# Patient Record
Sex: Female | Born: 1995 | Race: Black or African American | Hispanic: No | Marital: Single | Smoking: Never smoker
Health system: Southern US, Community
[De-identification: ages and names within clinical notes are randomized; demographics above are authoritative.]

## PROBLEM LIST (undated history)

## (undated) DIAGNOSIS — T7840XA Allergy, unspecified, initial encounter: Secondary | ICD-10-CM

## (undated) HISTORY — DX: Allergy, unspecified, initial encounter: T78.40XA

---
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
## 2014-12-21 ENCOUNTER — Encounter (HOSPITAL_COMMUNITY): Payer: Self-pay | Admitting: Emergency Medicine

## 2014-12-21 ENCOUNTER — Emergency Department (HOSPITAL_COMMUNITY)
Admission: EM | Admit: 2014-12-21 | Discharge: 2014-12-22 | Disposition: A | Discharge status: Home / Self Care | Payer: BC Managed Care – PPO | Attending: Emergency Medicine | Admitting: Emergency Medicine

## 2014-12-21 DIAGNOSIS — Z3202 Encounter for pregnancy test, result negative: Secondary | ICD-10-CM | POA: Insufficient documentation

## 2014-12-21 DIAGNOSIS — R52 Pain, unspecified: Secondary | ICD-10-CM

## 2014-12-21 DIAGNOSIS — R109 Unspecified abdominal pain: Secondary | ICD-10-CM

## 2014-12-21 DIAGNOSIS — M549 Dorsalgia, unspecified: Secondary | ICD-10-CM | POA: Insufficient documentation

## 2014-12-21 DIAGNOSIS — N200 Calculus of kidney: Secondary | ICD-10-CM | POA: Diagnosis not present

## 2014-12-21 NOTE — ED Notes (Signed)
Patient with flank and lower back pain.  Patient states that it started this morning on the right and has moved to the left.  Patient does have some nausea and vomiting with the pain.  Patient states she does have some urinary hesitency and retention.  She states that she has to go but only a small amount comes out.

## 2014-12-22 ENCOUNTER — Emergency Department (HOSPITAL_COMMUNITY): Payer: BC Managed Care – PPO

## 2014-12-22 LAB — URINE MICROSCOPIC-ADD ON

## 2014-12-22 LAB — URINALYSIS, ROUTINE W REFLEX MICROSCOPIC
BILIRUBIN URINE: NEGATIVE
GLUCOSE, UA: NEGATIVE mg/dL
Ketones, ur: 15 mg/dL — AB
LEUKOCYTES UA: NEGATIVE
Nitrite: NEGATIVE
PH: 5.5 (ref 5.0–8.0)
Protein, ur: NEGATIVE mg/dL
Specific Gravity, Urine: 1.036 — ABNORMAL HIGH (ref 1.005–1.030)
UROBILINOGEN UA: 0.2 mg/dL (ref 0.0–1.0)

## 2014-12-22 LAB — POC URINE PREG, ED: PREG TEST UR: NEGATIVE

## 2014-12-22 MED ORDER — ONDANSETRON 4 MG PO TBDP: 4.0000 mg | ORAL_TABLET | Freq: Three times a day (TID) | ORAL | Status: DC | PRN

## 2014-12-22 MED ORDER — TAMSULOSIN HCL 0.4 MG PO CAPS: 0.4000 mg | ORAL_CAPSULE | Freq: Every day | ORAL | Status: DC

## 2014-12-22 MED ORDER — OXYCODONE-ACETAMINOPHEN 5-325 MG PO TABS: 1.0000 | ORAL_TABLET | Freq: Four times a day (QID) | ORAL | Status: DC | PRN

## 2014-12-22 MED ORDER — KETOROLAC TROMETHAMINE 30 MG/ML IJ SOLN
30.0000 mg | Freq: Once | INTRAMUSCULAR | Status: AC
  Administered 2014-12-22: 30 mg via INTRAVENOUS
  Filled 2014-12-22: qty 1

## 2014-12-22 MED ORDER — ONDANSETRON 4 MG PO TBDP
4.0000 mg | ORAL_TABLET | Freq: Once | ORAL | Status: AC
  Administered 2014-12-22: 4 mg via ORAL
  Filled 2014-12-22: qty 1

## 2014-12-22 MED ORDER — TAMSULOSIN HCL 0.4 MG PO CAPS
0.4000 mg | ORAL_CAPSULE | Freq: Once | ORAL | Status: AC
  Administered 2014-12-22: 0.4 mg via ORAL
  Filled 2014-12-22: qty 1

## 2014-12-22 MED ORDER — HYDROMORPHONE HCL 1 MG/ML IJ SOLN
2.0000 mg | Freq: Once | INTRAMUSCULAR | Status: AC
  Administered 2014-12-22: 2 mg via INTRAMUSCULAR
  Filled 2014-12-22: qty 2

## 2014-12-22 NOTE — Discharge Instructions (Signed)
You have a 3 mm stone this should pass  You have been given prescriptions for pain and nausea control as well a Flomax that helps dilate the ureter to help the stone pass

## 2014-12-22 NOTE — ED Provider Notes (Signed)
Reviewed the Ct Scan Rx for Percocet, Flomax and Zofran given with urology FU to return if can not manage pain at home   Arman Filter, NP 12/22/14 0153  April Smitty Cords, MD 12/22/14 1610

## 2014-12-22 NOTE — ED Provider Notes (Signed)
CSN: [id number]     Arrival date & time 12/21/14  2309 History   First MD Initiated Contact with Patient 12/22/14 0023     Chief Complaint  Patient presents with  . Flank Pain  . Back Pain     (Consider location/radiation/quality/duration/timing/severity/associated sxs/prior Treatment) Patient is a 19 y.o. female presenting with flank pain and back pain. The history is provided by the patient. No language interpreter was used.  Flank Pain This is a new problem. The current episode started today. The problem occurs constantly. The problem has been gradually worsening. Associated symptoms include nausea and vomiting. Pertinent negatives include no abdominal pain. Nothing aggravates the symptoms. The treatment provided moderate relief.  Back Pain Associated symptoms: no abdominal pain   Pt reports she has pain in her left flank.   Pt reports vomiting and difficulty urinating  History reviewed. No pertinent past medical history. History reviewed. No pertinent past surgical history. History reviewed. No pertinent family history. History  Substance Use Topics  . Smoking status: Never Smoker   . Smokeless tobacco: Not on file  . Alcohol Use: Yes     Comment: socially   OB History    No data available     Review of Systems  Gastrointestinal: Positive for nausea and vomiting. Negative for abdominal pain.  Genitourinary: Positive for flank pain.  Musculoskeletal: Positive for back pain.  All other systems reviewed and are negative.     Allergies  Review of patient's allergies indicates no known allergies.  Home Medications   Prior to Admission medications   Medication Sig Start Date End Date Taking? Authorizing Provider  medroxyPROGESTERone (DEPO-PROVERA) 150 MG/ML injection Inject 150 mg into the muscle every 3 (three) months.   Yes Historical Provider, MD   BP 117/78 mmHg  Pulse 102  Temp(Src) 98 F (36.7 C) (Oral)  Resp 16  Ht  (1.6 m)  Wt 134 lb (60.782 kg)  BMI  23.74 kg/m2  SpO2 100%  LMP  (LMP Unknown) Physical Exam  Constitutional: She is oriented to person, place, and time. She appears well-developed and well-nourished.  HENT:  Head: Normocephalic.  Eyes: EOM are normal. Pupils are equal, round, and reactive to light.  Neck: Normal range of motion.  Cardiovascular: Normal rate and normal heart sounds.   Pulmonary/Chest: Effort normal.  Abdominal: She exhibits no distension.  Tender left flank  Musculoskeletal: Normal range of motion.  Neurological: She is alert and oriented to person, place, and time.  Psychiatric: She has a normal mood and affect.  Nursing note and vitals reviewed.   ED Course  Procedures (including critical care time) Labs Review Labs Reviewed  URINALYSIS, ROUTINE W REFLEX MICROSCOPIC - Abnormal; Notable for the following:    Specific Gravity, Urine 1.036 (*)    Hgb urine dipstick SMALL (*)    Ketones, ur 15 (*)    All other components within normal limits  URINE MICROSCOPIC-ADD ON  POC URINE PREG, ED   Results for orders placed or performed during the hospital encounter of 12/21/14  Urinalysis with microscopic  Result Value Ref Range   Color, Urine YELLOW YELLOW   APPearance CLEAR CLEAR   Specific Gravity, Urine 1.036 (H) 1.005 - 1.030   pH 5.5 5.0 - 8.0   Glucose, UA NEGATIVE NEGATIVE mg/dL   Hgb urine dipstick SMALL (A) NEGATIVE   Bilirubin Urine NEGATIVE NEGATIVE   Ketones, ur 15 (A) NEGATIVE mg/dL   Protein, ur NEGATIVE NEGATIVE mg/dL   Urobilinogen, UA 0.2  0.0 - 1.0 mg/dL   Nitrite NEGATIVE NEGATIVE   Leukocytes, UA NEGATIVE NEGATIVE  Urine microscopic-add on  Result Value Ref Range   Squamous Epithelial / LPF RARE RARE   WBC, UA 0-2 <3 WBC/hpf   RBC / HPF 3-6 <3 RBC/hpf   Bacteria, UA RARE RARE   Urine-Other MUCOUS PRESENT   POC Urine Preg, ED (do not order at [facility])  Result Value Ref Range   Preg Test, Ur NEGATIVE NEGATIVE   No results found.  Imaging Review Ct Renal Stone  Study  12/22/2014   CLINICAL DATA:  Acute onset of left flank pain, nausea, vomiting and diarrhea. Increased urinary urgency.  EXAM: CT ABDOMEN AND PELVIS WITHOUT CONTRAST  TECHNIQUE: Multidetector CT imaging of the abdomen and pelvis was performed following the standard protocol without IV contrast.  COMPARISON:  None.  FINDINGS: The visualized lung bases are clear.  The liver and spleen are unremarkable in appearance. The gallbladder is within normal limits. The pancreas and adrenal glands are unremarkable.  There is mild left-sided hydronephrosis, with distention of the proximal left ureter. This is thought to reflect a small obstructing 3 mm stone at the distal left ureter, just proximal to the left vesicoureteral junction. A few small nonobstructing right renal stones are seen, measuring up to 3 mm in size.  No free fluid is identified. The small bowel is unremarkable in appearance. The stomach is within normal limits. No acute vascular abnormalities are seen.  The appendix is grossly unremarkable in appearance, without evidence for appendicitis. The colon is grossly unremarkable.  The bladder is relatively decompressed and not well assessed. The uterus is grossly unremarkable in appearance. The ovaries are relatively symmetric. No suspicious adnexal masses are seen. No inguinal lymphadenopathy is seen.  No acute osseous abnormalities are identified. 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  IMPRESSION: 1. Mild left-sided hydronephrosis, with distention of the proximal left ureter. This is thought to reflect a small 3 mm obstructing stone at the distal left ureter, just proximal to the left vesicoureteral junction. 2. Few small nonobstructing right renal stones, measuring up to 3 mm in size.   Electronically Signed   By: Roanna Raider M.D.   On: 12/22/2014 01:36     EKG Interpretation None      MDM Pt given dilaudid and zofran.    Ct pending   Final diagnoses:  Pain  Flank pain  Kidney stone    Pt's care turned over to Sharen Hones.     Elson Areas, PA-C 12/22/14 1700  Elson Areas, PA-C 12/22/14 1701  Nelia Shi, MD 12/26/14 [phone]

## 2016-02-16 ENCOUNTER — Ambulatory Visit (INDEPENDENT_AMBULATORY_CARE_PROVIDER_SITE_OTHER): Payer: 59 | Admitting: Physician Assistant

## 2016-02-16 VITALS — BP 114/70 | HR 108 | Temp 98.8°F | Resp 16 | Ht 64.25 in | Wt 140.8 lb

## 2016-02-16 DIAGNOSIS — R6889 Other general symptoms and signs: Secondary | ICD-10-CM

## 2016-02-16 MED ORDER — OSELTAMIVIR PHOSPHATE 75 MG PO CAPS: 75.0000 mg | ORAL_CAPSULE | Freq: Two times a day (BID) | ORAL | Status: AC

## 2016-02-16 MED ORDER — HYDROCOD POLST-CPM POLST ER 10-8 MG/5ML PO SUER: 5.0000 mL | Freq: Two times a day (BID) | ORAL | Status: AC | PRN

## 2016-02-16 NOTE — Patient Instructions (Signed)
Drink plenty of water (64 oz/day) and get plenty of rest. If you have been prescribed a cough syrup, do not drive or operate heavy machinery while using this medication. Tylenol or ibuprofen around the clock for body aches and fever Take tamiflu twice a day for 5 days. If your symptoms are not improving in 1 week, return to clinic.

## 2016-02-16 NOTE — Progress Notes (Signed)
[facility] [address], [place] [address] [phone]  Date:  02/16/2016   Name:  Claudia Deleon   DOB:  Apr 22, 1996   MRN:  [id number]  PCP:  No primary care provider on file.    Chief Complaint: Nasal Congestion; Sore Throat; Epistaxis; Excessive Sweating; Fatigue; Shortness of Breath; Fever; Chills; Cough; and Flu Vaccine   History of Present Illness:  This is a 20 y.o. female who is presenting with 2 days of nasal congestion, sore throat, fatigue, dry cough, fever, chills. States temp last night 103. States she put "potatoes in my sock" and fever came down. Not having chills as bad anymore. Feels a little sob but no wheezing.  History of asthma: no History of env allergies: yes, mild Tobacco use: no No sick contacts.  Review of Systems:  Review of Systems See HPI  There are no active problems to display for this patient.   Prior to Admission medications   Medication Sig Start Date End Date Taking? Authorizing Provider  medroxyPROGESTERone (DEPO-PROVERA) 150 MG/ML injection Inject 150 mg into the muscle every 3 (three) months.   Yes Historical Provider, MD    No Known Allergies  History reviewed. No pertinent past surgical history.  Social History  Substance Use Topics  . Smoking status: Never Smoker   . Smokeless tobacco: None  . Alcohol Use: Yes     Comment: socially    History reviewed. No pertinent family history.  Medication list has been reviewed and updated.  Physical Examination:  Physical Exam  Constitutional: She is oriented to person, place, and time. She appears well-developed and well-nourished. No distress.  HENT:  Head: Normocephalic and atraumatic.  Right Ear: Hearing, tympanic membrane, external ear and ear canal normal.  Left Ear: Hearing, tympanic membrane, external ear and ear canal normal.  Nose: Nose normal.  Mouth/Throat: Uvula is midline and mucous membranes are normal. Posterior oropharyngeal erythema  (mild) present. No oropharyngeal exudate or posterior oropharyngeal edema.  Eyes: Conjunctivae and lids are normal. Right eye exhibits no discharge. Left eye exhibits no discharge. No scleral icterus.  Cardiovascular: Regular rhythm, normal heart sounds and normal pulses.   No murmur heard. Mild tachycardia  Pulmonary/Chest: Effort normal and breath sounds normal. No respiratory distress. She has no wheezes. She has no rhonchi. She has no rales.  Musculoskeletal: Normal range of motion.  Lymphadenopathy:       Head (right side): No submental, no submandibular and no tonsillar adenopathy present.       Head (left side): No submental, no submandibular and no tonsillar adenopathy present.    She has no cervical adenopathy.  Neurological: She is alert and oriented to person, place, and time.  Skin: Skin is warm, dry and intact. No lesion and no rash noted.  Psychiatric: She has a normal mood and affect. Her speech is normal and behavior is normal. Thought content normal.    BP 114/70 mmHg  Pulse 108  Temp(Src) 98.8 F (37.1 C) (Oral)  Resp 16  Ht 5' 4.25" (1.632 m)  Wt 140 lb 12.8 oz (63.866 kg)  BMI 23.98 kg/m2  SpO2 97%  Assessment and Plan:  1. Flu-like symptoms No fever currently however, has had temp at home of 103. Will treat with tamiflu empirically.focus is on supportive care otherwise. Discussed important of hydration. Return in 1 week if symptoms do not improve or at any time if symptoms worsen.  - oseltamivir (TAMIFLU) 75 MG capsule; Take 1 capsule (  75 mg total) by mouth 2 (two) times daily.  Dispense: 10 capsule; Refill: 0 - chlorpheniramine-HYDROcodone (TUSSIONEX PENNKINETIC ER) 10-8 MG/5ML SUER; Take 5 mLs by mouth every 12 (twelve) hours as needed for cough.  Dispense: 100 mL; Refill: 0   [place] [employer]. Dyke Brackett, MHS [facility]  02/19/2016

## 2017-04-28 DIAGNOSIS — Z6824 Body mass index (BMI) 24.0-24.9, adult: Secondary | ICD-10-CM | POA: Diagnosis not present

## 2017-04-28 DIAGNOSIS — Z01419 Encounter for gynecological examination (general) (routine) without abnormal findings: Secondary | ICD-10-CM | POA: Diagnosis not present

## 2017-07-05 DIAGNOSIS — R300 Dysuria: Secondary | ICD-10-CM | POA: Diagnosis not present

## 2017-07-05 DIAGNOSIS — N760 Acute vaginitis: Secondary | ICD-10-CM | POA: Diagnosis not present

## 2017-09-26 DIAGNOSIS — Z0000 Encounter for general adult medical examination without abnormal findings: Secondary | ICD-10-CM | POA: Diagnosis not present

## 2017-11-14 DIAGNOSIS — J209 Acute bronchitis, unspecified: Secondary | ICD-10-CM | POA: Diagnosis not present

## 2017-11-15 DIAGNOSIS — R05 Cough: Secondary | ICD-10-CM | POA: Diagnosis not present

## 2017-12-09 DIAGNOSIS — J038 Acute tonsillitis due to other specified organisms: Secondary | ICD-10-CM | POA: Diagnosis not present

## 2017-12-22 DIAGNOSIS — J039 Acute tonsillitis, unspecified: Secondary | ICD-10-CM | POA: Diagnosis not present

## 2017-12-29 DIAGNOSIS — Z0000 Encounter for general adult medical examination without abnormal findings: Secondary | ICD-10-CM | POA: Diagnosis not present

## 2017-12-29 DIAGNOSIS — Z13220 Encounter for screening for lipoid disorders: Secondary | ICD-10-CM | POA: Diagnosis not present

## 2017-12-30 DIAGNOSIS — J039 Acute tonsillitis, unspecified: Secondary | ICD-10-CM | POA: Diagnosis not present

## 2018-02-07 DIAGNOSIS — R21 Rash and other nonspecific skin eruption: Secondary | ICD-10-CM | POA: Diagnosis not present

## 2018-02-07 DIAGNOSIS — H1045 Other chronic allergic conjunctivitis: Secondary | ICD-10-CM | POA: Diagnosis not present

## 2018-02-07 DIAGNOSIS — J309 Allergic rhinitis, unspecified: Secondary | ICD-10-CM | POA: Diagnosis not present

## 2018-02-16 DIAGNOSIS — J301 Allergic rhinitis due to pollen: Secondary | ICD-10-CM | POA: Diagnosis not present

## 2018-02-17 DIAGNOSIS — J3089 Other allergic rhinitis: Secondary | ICD-10-CM | POA: Diagnosis not present

## 2018-02-17 DIAGNOSIS — J3081 Allergic rhinitis due to animal (cat) (dog) hair and dander: Secondary | ICD-10-CM | POA: Diagnosis not present

## 2018-03-10 DIAGNOSIS — J3089 Other allergic rhinitis: Secondary | ICD-10-CM | POA: Diagnosis not present

## 2018-03-21 DIAGNOSIS — R109 Unspecified abdominal pain: Secondary | ICD-10-CM | POA: Diagnosis not present

## 2018-03-21 DIAGNOSIS — R195 Other fecal abnormalities: Secondary | ICD-10-CM | POA: Diagnosis not present

## 2018-03-21 DIAGNOSIS — R300 Dysuria: Secondary | ICD-10-CM | POA: Diagnosis not present

## 2018-07-04 DIAGNOSIS — Z118 Encounter for screening for other infectious and parasitic diseases: Secondary | ICD-10-CM | POA: Diagnosis not present

## 2018-07-04 DIAGNOSIS — Z1159 Encounter for screening for other viral diseases: Secondary | ICD-10-CM | POA: Diagnosis not present

## 2018-07-18 DIAGNOSIS — R109 Unspecified abdominal pain: Secondary | ICD-10-CM | POA: Diagnosis not present

## 2018-07-18 DIAGNOSIS — K5900 Constipation, unspecified: Secondary | ICD-10-CM | POA: Diagnosis not present

## 2018-10-26 DIAGNOSIS — K219 Gastro-esophageal reflux disease without esophagitis: Secondary | ICD-10-CM | POA: Diagnosis not present

## 2018-10-31 DIAGNOSIS — K219 Gastro-esophageal reflux disease without esophagitis: Secondary | ICD-10-CM | POA: Diagnosis not present

## 2018-10-31 DIAGNOSIS — R112 Nausea with vomiting, unspecified: Secondary | ICD-10-CM | POA: Diagnosis not present

## 2018-10-31 DIAGNOSIS — R1011 Right upper quadrant pain: Secondary | ICD-10-CM | POA: Diagnosis not present

## 2018-11-02 ENCOUNTER — Other Ambulatory Visit: Payer: Self-pay | Admitting: Family Medicine

## 2018-11-02 DIAGNOSIS — R1011 Right upper quadrant pain: Secondary | ICD-10-CM

## 2018-11-07 ENCOUNTER — Ambulatory Visit
Admission: RE | Admit: 2018-11-07 | Discharge: 2018-11-07 | Disposition: A | Discharge status: Home / Self Care | Payer: BC Managed Care – PPO | Source: Ambulatory Visit | Attending: Family Medicine | Admitting: Family Medicine

## 2018-11-07 DIAGNOSIS — R1011 Right upper quadrant pain: Secondary | ICD-10-CM

## 2018-11-24 DIAGNOSIS — R079 Chest pain, unspecified: Secondary | ICD-10-CM | POA: Diagnosis not present

## 2018-11-24 DIAGNOSIS — R0789 Other chest pain: Secondary | ICD-10-CM | POA: Diagnosis not present

## 2018-11-30 ENCOUNTER — Other Ambulatory Visit: Payer: Self-pay | Admitting: Gastroenterology

## 2018-11-30 DIAGNOSIS — R748 Abnormal levels of other serum enzymes: Secondary | ICD-10-CM | POA: Diagnosis not present

## 2018-11-30 DIAGNOSIS — R1010 Upper abdominal pain, unspecified: Secondary | ICD-10-CM | POA: Diagnosis not present

## 2018-11-30 DIAGNOSIS — R112 Nausea with vomiting, unspecified: Secondary | ICD-10-CM | POA: Diagnosis not present

## 2018-12-10 ENCOUNTER — Other Ambulatory Visit: Payer: BC Managed Care – PPO

## 2018-12-11 ENCOUNTER — Inpatient Hospital Stay: Admission: RE | Admit: 2018-12-11 | Payer: BC Managed Care – PPO | Source: Ambulatory Visit

## 2018-12-23 ENCOUNTER — Ambulatory Visit
Admission: RE | Admit: 2018-12-23 | Discharge: 2018-12-23 | Disposition: A | Discharge status: Home / Self Care | Payer: BC Managed Care – PPO | Source: Ambulatory Visit | Attending: Gastroenterology | Admitting: Gastroenterology

## 2018-12-23 DIAGNOSIS — R748 Abnormal levels of other serum enzymes: Secondary | ICD-10-CM

## 2018-12-23 DIAGNOSIS — R112 Nausea with vomiting, unspecified: Secondary | ICD-10-CM

## 2018-12-23 DIAGNOSIS — R1010 Upper abdominal pain, unspecified: Secondary | ICD-10-CM

## 2018-12-23 MED ORDER — GADOBENATE DIMEGLUMINE 529 MG/ML IV SOLN
14.0000 mL | Freq: Once | INTRAVENOUS | Status: AC | PRN
  Administered 2018-12-23: 14 mL via INTRAVENOUS

## 2021-01-03 IMAGING — MR MR ABDOMEN WO/W CM MRCP
18 of 20 series · 44 of 48 positions shown · IV contrast (14ml multihance)
Comparison: CT AP 12/22/2014

CLINICAL DATA: Elevated amylase

EXAM:
MRI ABDOMEN WITHOUT AND WITH CONTRAST (INCLUDING MRCP)
TECHNIQUE: Multiplanar multisequence MR imaging of the abdomen was performed
both before and after the administration of intravenous contrast.
Heavily T2-weighted images of the biliary and pancreatic ducts were
obtained, and three-dimensional MRCP images were rendered by post
processing.
CONTRAST:  14mL MULTIHANCE GADOBENATE DIMEGLUMINE 529 MG/ML IV SOLN

[Series 3: T2 · coronal · 5.0mm · 1.56mm/px · 1 of 36 slices shown (1 of 4)]
[im 1/36]
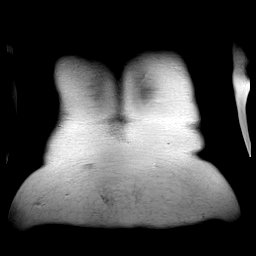

[Series 4: T1 · axial · 3.0mm · 1.19mm/px · z∈[-31,+182]mm · 5 of 144 slices shown]
[im 1/144]
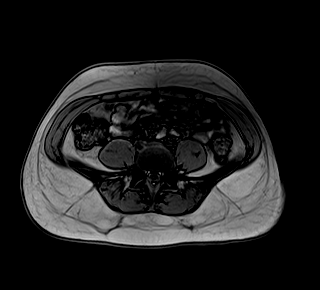
[im 36/144]
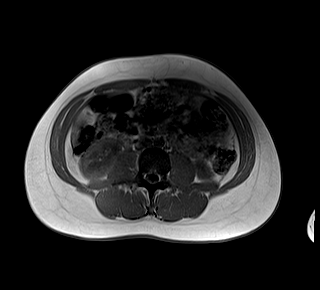
[im 72/144]
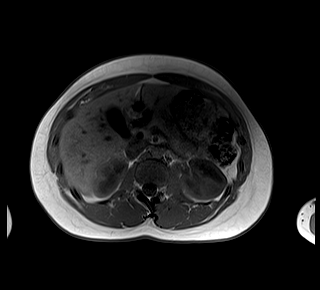
[im 108/144]
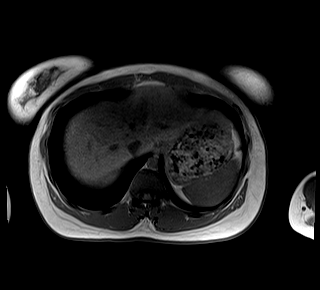
[im 144/144]
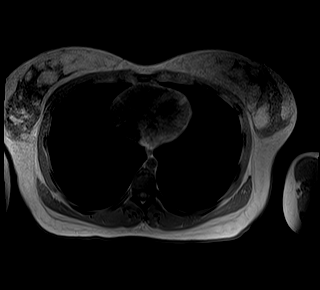

[Series 5: T2 · axial · 5.0mm · 1.48mm/px · 1 of 38 slices shown (2 of 4)]
[im 1/38]
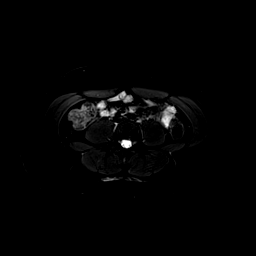

[Series 6: DWI · axial · 5.0mm · 1.42mm/px · z∈[-2,+208]mm · 4 of 108 slices shown (1 of 2)]
[im 1/108]
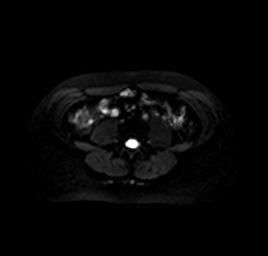
[im 36/108]
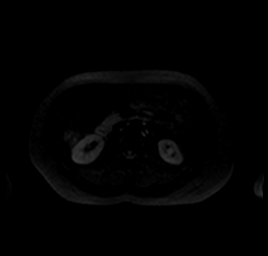
[im 72/108]
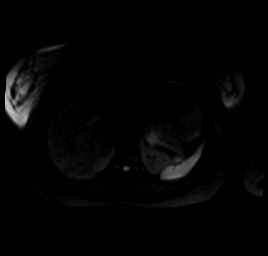
[im 108/108]
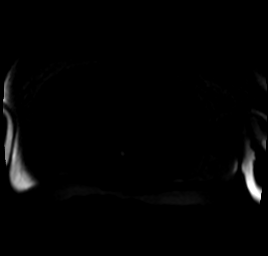

[Series 7: DWI · axial · 5.0mm · 1.42mm/px · 1 of 36 slices shown (2 of 2)]
[im 1/36]
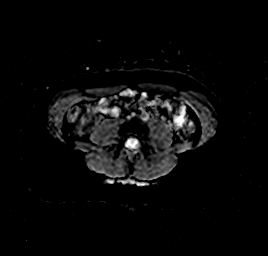

[Series 8: T2 · axial · 6.0mm · 1.22mm/px · 1 of 30 slices shown (3 of 4)]
[im 1/30]
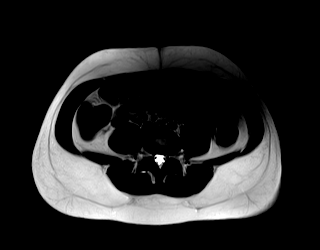

[Series 9: bSSFP · axial · 5.0mm · 1.25mm/px · 1 of 38 slices shown]
[im 1/38]
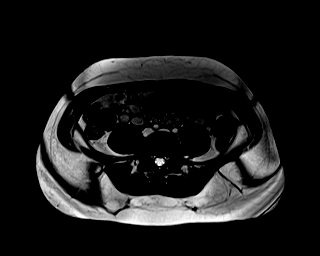

[Series 12: T2 · coronal · 3.0mm · 1.19mm/px · 1 of 23 slices shown (4 of 4)]
[im 1/23]
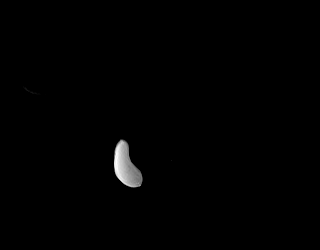

[Series 13: MRCP · coronal · 1.0mm · 0.49mm/px · 2 of 64 slices shown]
[im 1/64]
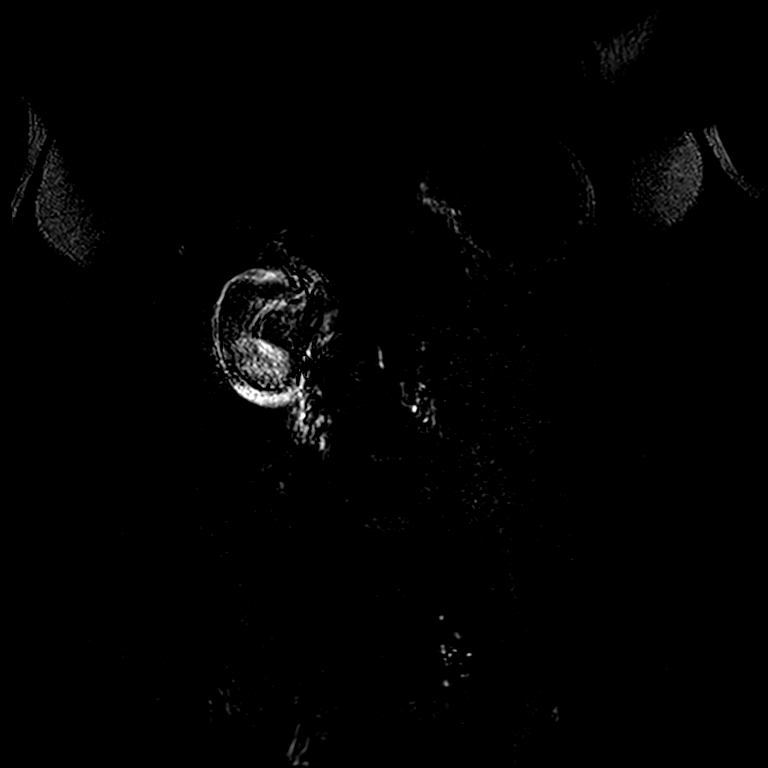
[im 64/64]
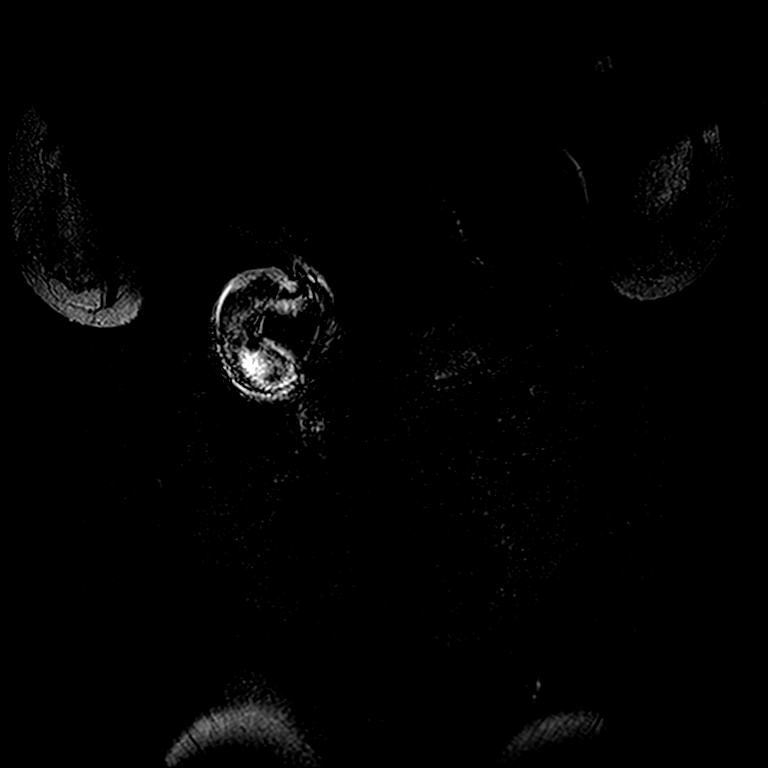

[Series 15: T1 dynamic · axial · non-contrast · 3.0mm · 1.25mm/px · z∈[-14,+199]mm · 3 of 72 slices shown]
[im 1/72]
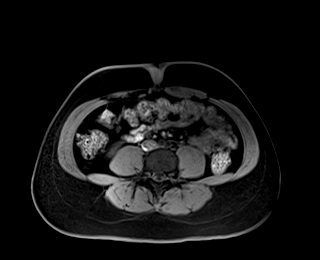
[im 36/72]
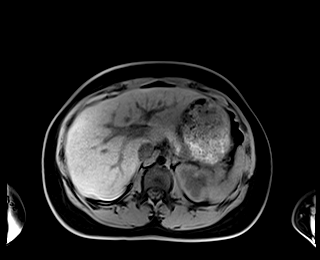
[im 72/72]
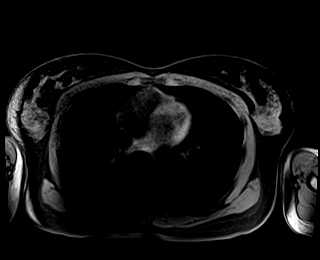

[Series 16: T1 dynamic post-contrast · axial · 3.0mm · 1.25mm/px · z∈[-14,+199]mm · 3 of 72 slices shown (1 of 8)]
[im 1/72]
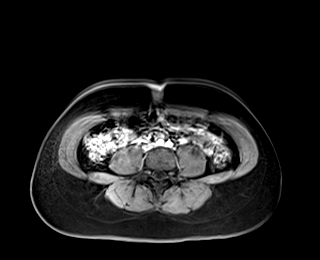
[im 36/72]
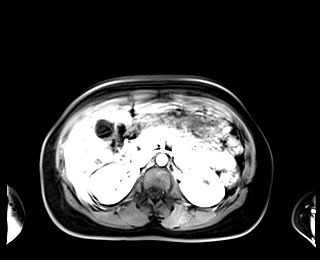
[im 72/72]
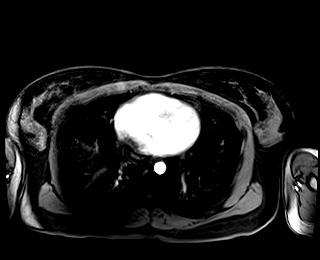

[Series 17: T1 dynamic post-contrast · axial · 3.0mm · 1.25mm/px · z∈[-14,+199]mm · 3 of 72 slices shown (2 of 8)]
[im 1/72]
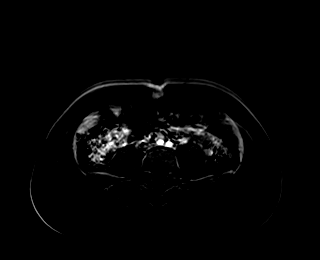
[im 36/72]
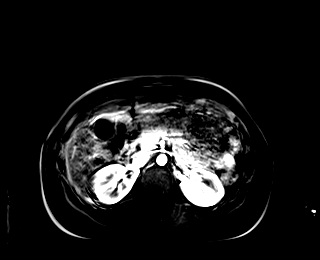
[im 72/72]
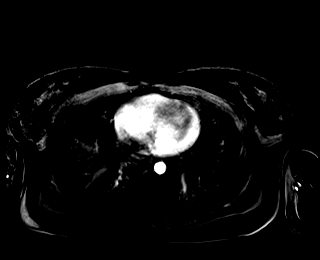

[Series 18: T1 dynamic post-contrast · axial · 3.0mm · 1.25mm/px · z∈[-14,+199]mm · 3 of 72 slices shown (3 of 8)]
[im 1/72]
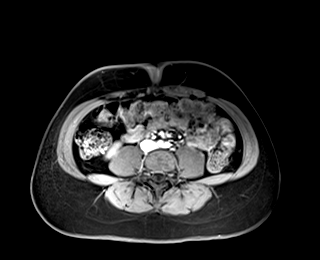
[im 36/72]
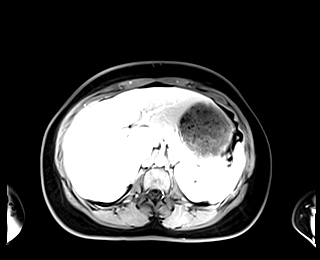
[im 72/72]
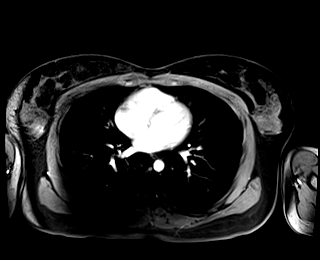

[Series 19: T1 dynamic post-contrast · axial · 3.0mm · 1.25mm/px · z∈[-14,+199]mm · 3 of 72 slices shown (4 of 8)]
[im 1/72]
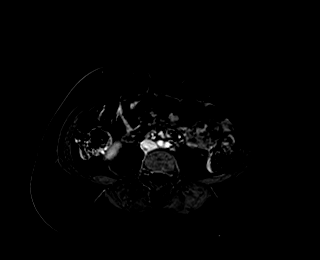
[im 36/72]
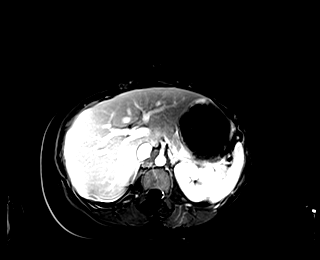
[im 72/72]
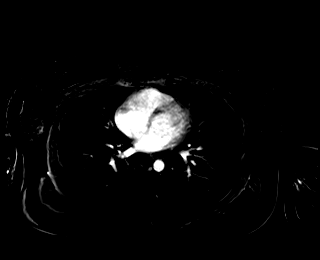

[Series 20: T1 dynamic post-contrast · axial · 3.0mm · 1.25mm/px · z∈[-14,+199]mm · 3 of 72 slices shown (5 of 8)]
[im 1/72]
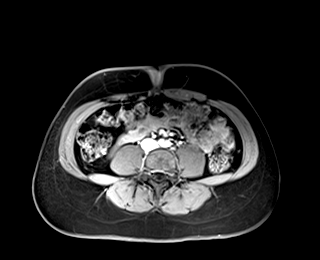
[im 36/72]
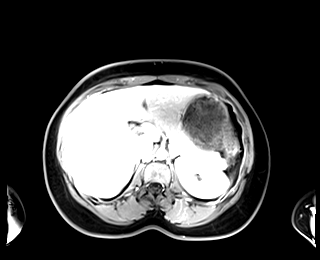
[im 72/72]
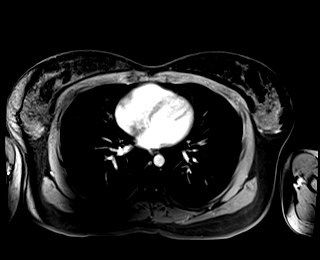

[Series 21: T1 dynamic post-contrast · axial · 3.0mm · 1.25mm/px · z∈[-14,+199]mm · 3 of 72 slices shown (6 of 8)]
[im 1/72]
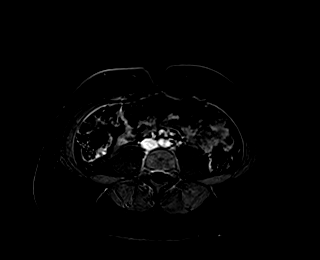
[im 36/72]
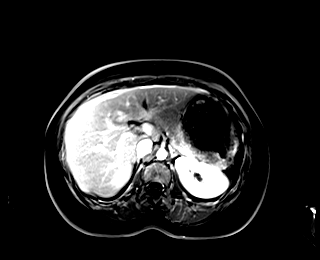
[im 72/72]
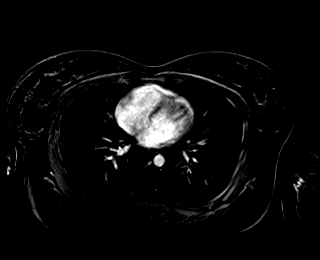

[Series 22: T1 dynamic post-contrast · coronal · 3.0mm · 1.25mm/px · 3 of 72 slices shown (7 of 8)]
[im 1/72]
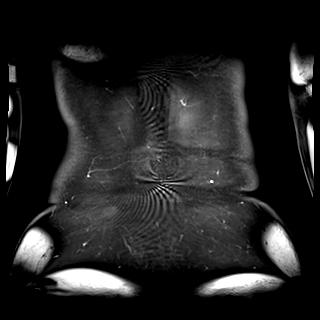
[im 36/72]
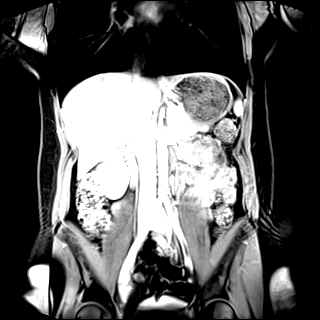
[im 72/72]
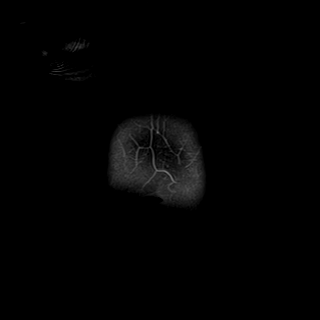

[Series 23: T1 dynamic post-contrast · axial · 3.0mm · 1.25mm/px · z∈[-14,+199]mm · 3 of 72 slices shown (8 of 8)]
[im 1/72]
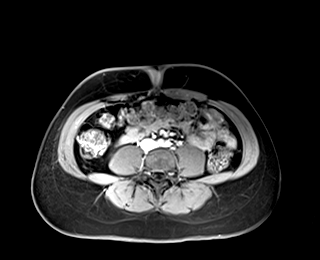
[im 36/72]
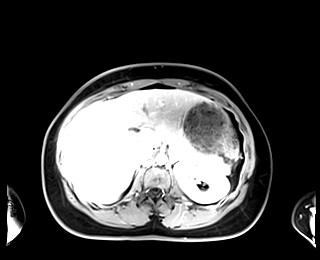
[im 72/72]
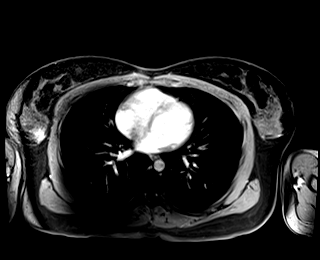

[44 of 48 positions shown; findings below may reference images not displayed]

FINDINGS: Lower chest: No acute findings.

Hepatobiliary: Tiny cyst and right lobe of liver measures 5 mm. No
other focal parenchymal abnormality noted. Unremarkable appearance
of the gallbladder. The CBD measures up to 7 mm. No
choledocholithiasis identified.

Pancreas: No mass, inflammatory changes, or other parenchymal
abnormality identified.

Spleen:  Within normal limits in size and appearance.

Adrenals/Urinary Tract: Normal appearance of the adrenal glands. The
kidneys are unremarkable. No mass or hydronephrosis.

Stomach/Bowel: Stomach normal. The visualized bowel loops are
unremarkable.

Vascular/Lymphatic: No pathologically enlarged lymph nodes
identified. No abdominal aortic aneurysm demonstrated.

Other:  None.

Musculoskeletal: No suspicious bone lesions identified.
IMPRESSION: 1. Mild increase caliber of the CBD measuring 7 mm. No
choledocholithiasis or mass identified.
2. Tiny right lobe of liver cyst.

## 2022-05-16 ENCOUNTER — Ambulatory Visit: Payer: BC Managed Care – PPO
